# Patient Record
Sex: Male | Born: 1990 | Race: Black or African American | Hispanic: No | Marital: Single | Smoking: Never smoker
Health system: Southern US, Community
[De-identification: ages and names within clinical notes are randomized; demographics above are authoritative.]

## PROBLEM LIST (undated history)

## (undated) HISTORY — PX: TONSILLECTOMY: SUR1361

## (undated) HISTORY — PX: ADENOIDECTOMY: SUR15

---

## 2012-07-15 ENCOUNTER — Emergency Department (HOSPITAL_COMMUNITY): Payer: Self-pay

## 2012-07-15 ENCOUNTER — Encounter (HOSPITAL_COMMUNITY): Payer: Self-pay | Admitting: Emergency Medicine

## 2012-07-15 ENCOUNTER — Emergency Department (HOSPITAL_COMMUNITY)
Admission: EM | Admit: 2012-07-15 | Discharge: 2012-07-15 | Disposition: A | Discharge status: Home / Self Care | Payer: Self-pay | Attending: Emergency Medicine | Admitting: Emergency Medicine

## 2012-07-15 DIAGNOSIS — S6980XA Other specified injuries of unspecified wrist, hand and finger(s), initial encounter: Secondary | ICD-10-CM | POA: Insufficient documentation

## 2012-07-15 DIAGNOSIS — W230XXA Caught, crushed, jammed, or pinched between moving objects, initial encounter: Secondary | ICD-10-CM | POA: Insufficient documentation

## 2012-07-15 DIAGNOSIS — M79644 Pain in right finger(s): Secondary | ICD-10-CM

## 2012-07-15 DIAGNOSIS — Y929 Unspecified place or not applicable: Secondary | ICD-10-CM | POA: Insufficient documentation

## 2012-07-15 DIAGNOSIS — Y939 Activity, unspecified: Secondary | ICD-10-CM | POA: Insufficient documentation

## 2012-07-15 DIAGNOSIS — S6990XA Unspecified injury of unspecified wrist, hand and finger(s), initial encounter: Secondary | ICD-10-CM | POA: Insufficient documentation

## 2012-07-15 NOTE — ED Provider Notes (Signed)
History    CSN: [id number] Arrival date & time 07/15/12  1026  First MD Initiated Contact with Patient 07/15/12 1049     Chief Complaint  Patient presents with  . Hand Pain   (Consider location/radiation/quality/duration/timing/severity/associated sxs/prior Treatment) HPI Comments: 22 y.o.male with no PMHx presents complaining of sudden onset pain to distal phalanx of index finger of right hand after slamming it between doors twice in the same location: once on Thursday and again on Sunday. Pt admits mild numbness, bruising, swelling, discoloration of nail. FROM. Pt states pain is moderate, described as pressure, localized, constant, worse with bending. Pt has taken no interventions.     Patient is a 22 y.o. male presenting with hand pain.  Hand Pain Associated symptoms include joint swelling. Pertinent negatives include no chest pain, diaphoresis, fever, headaches, nausea, neck pain, numbness, vomiting or weakness.   History reviewed. No pertinent past medical history. Past Surgical History  Procedure Laterality Date  . Tonsillectomy    . Adenoidectomy     No family history on file. History  Substance Use Topics  . Smoking status: Never Smoker   . Smokeless tobacco: Not on file  . Alcohol Use: No    Review of Systems  Constitutional: Negative for fever and diaphoresis.  HENT: Negative for neck pain and neck stiffness.   Eyes: Negative for visual disturbance.  Respiratory: Negative for apnea, chest tightness and shortness of breath.   Cardiovascular: Negative for chest pain and palpitations.  Gastrointestinal: Negative for nausea, vomiting, diarrhea and constipation.  Genitourinary: Negative for dysuria.  Musculoskeletal: Positive for joint swelling. Negative for gait problem.       Distal phalanx of right index finger  Skin: Positive for color change.       Distal phalanx of right index finger, bruising, swelling, nail discoloration    Neurological: Negative for  dizziness, weakness, light-headedness, numbness and headaches.    Allergies  Review of patient's allergies indicates no known allergies.  Home Medications   Current Outpatient Rx  Name  Route  Sig  Dispense  Refill  . ibuprofen (ADVIL,MOTRIN) 800 MG tablet   Oral   Take 800 mg by mouth every 8 (eight) hours as needed for pain.          BP 119/71  Pulse 59  Temp(Src) 97.9 F (36.6 C) (Oral)  Resp 18  Ht 6' (1.829 m)  Wt 155 lb (70.308 kg)  BMI 21.02 kg/m2  SpO2 98% Physical Exam  Nursing note and vitals reviewed. Constitutional: He is oriented to person, place, and time. He appears well-developed and well-nourished. No distress.  HENT:  Head: Normocephalic and atraumatic.  Eyes: Conjunctivae and EOM are normal.  Neck: Normal range of motion. Neck supple.  No meningeal signs  Cardiovascular: Normal rate, regular rhythm and normal heart sounds.  Exam reveals no gallop and no friction rub.   No murmur heard. Pulmonary/Chest: Effort normal and breath sounds normal. No respiratory distress. He has no wheezes. He has no rales. He exhibits no tenderness.  Abdominal: Soft. Bowel sounds are normal. He exhibits no distension. There is no tenderness. There is no rebound and no guarding.  Musculoskeletal: Normal range of motion. He exhibits no edema and no tenderness.  FROM to upper and lower extremities. FROM of all digits including injured index finger on left hand  Neurological: He is alert and oriented to person, place, and time. No cranial nerve deficit.  Speech is clear and goal oriented, follows commands Sensation normal to  light touch and two point discrimination Moves extremities without ataxia, coordination intact Normal gait and balance Normal strength in upper and lower extremities bilaterally including dorsiflexion and plantar flexion, strong and equal grip strength   Skin: Skin is warm and dry. He is not diaphoretic. No erythema.  Psychiatric: He has a normal mood and  affect.    ED Course  Procedures (including critical care time) Labs Reviewed - No data to display Dg Finger Index Right  07/15/2012   *RADIOLOGY REPORT*  Clinical Data: Finger injury with numbness and swelling.  RIGHT INDEX FINGER 2+V  Comparison: None.  Findings: No acute osseous or joint abnormality.  Mild soft tissue swelling is seen distally.  IMPRESSION: No acute osseous or joint abnormality.   Original Report Authenticated By: Leanna Battles, M.D.   1. Pain in finger of right hand     MDM  Imaging shows no fracture. Directed pt to ice injury, take acetaminophen or ibuprofen for pain, and to elevate and rest the injury when possible. Drained some of the blood with 18 gauge needle to relieve pressure. Pt felt relief, better range of motion, and better sensation afterward. Discussed reasons to seek immediate care. Patient expresses understanding and agrees with plan.   Glade Nurse, PA-C 07/15/12 1701

## 2012-07-15 NOTE — ED Notes (Signed)
Patient states slammed his R index finger in between doors.  Patient advised is losing feeling in the finger tip and wanted to get checked out.

## 2012-07-16 NOTE — ED Provider Notes (Signed)
Medical screening examination/treatment/procedure(s) were performed by non-physician practitioner and as supervising physician I was immediately available for consultation/collaboration.  Joshaua Epple M Katriona Schmierer, MD 07/16/12 1411 

## 2014-08-28 IMAGING — CR DG FINGER INDEX 2+V*R*
3 series · 3 of 3 positions shown · non-contrast
Comparison: None.

CLINICAL DATA: Finger injury with numbness and swelling.

RIGHT INDEX FINGER 2+V

[x finger pa right]
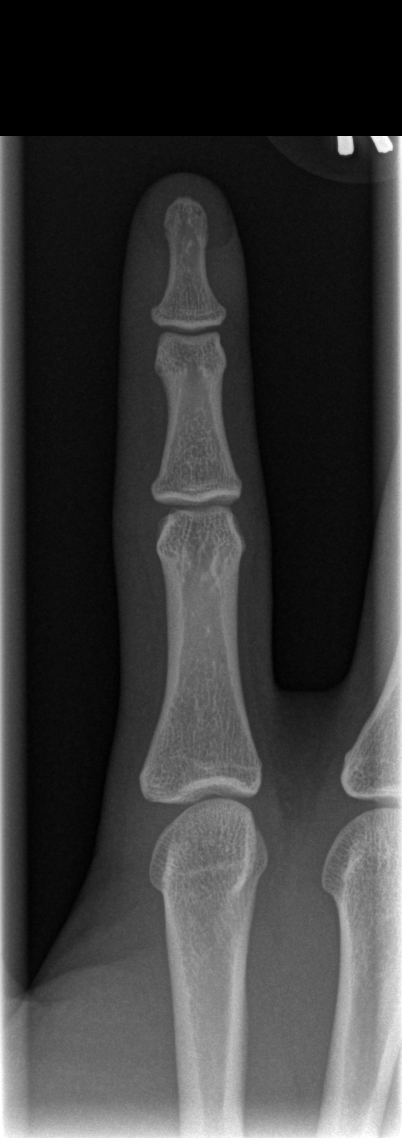

[x finger obl. right]
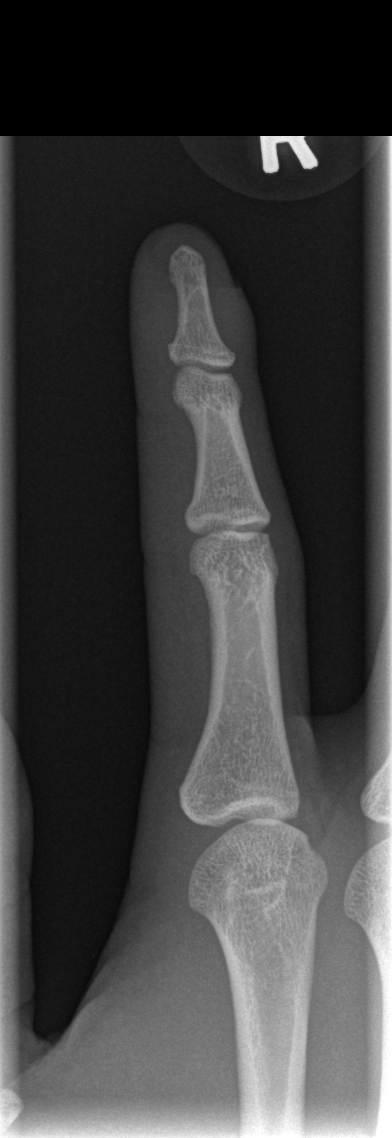

[x finger lateral right]
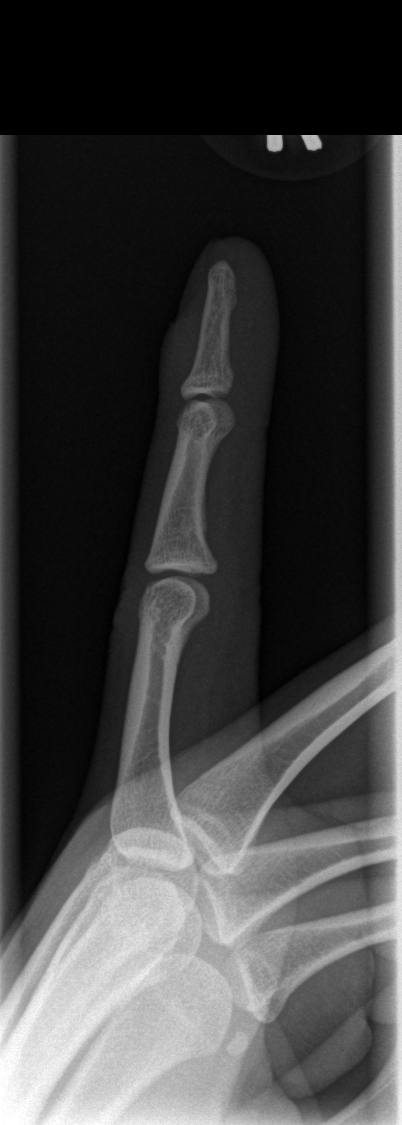

[3 of 3 positions shown; findings below may reference images not displayed]

FINDINGS: No acute osseous or joint abnormality.  Mild soft tissue
swelling is seen distally.
IMPRESSION: No acute osseous or joint abnormality.

## 2018-07-09 ENCOUNTER — Encounter (HOSPITAL_COMMUNITY): Payer: Self-pay

## 2018-07-09 ENCOUNTER — Other Ambulatory Visit: Payer: Self-pay

## 2018-07-09 ENCOUNTER — Ambulatory Visit (HOSPITAL_COMMUNITY)
Admission: EM | Admit: 2018-07-09 | Discharge: 2018-07-09 | Disposition: A | Discharge status: Home / Self Care | Payer: BC Managed Care – PPO | Attending: Family Medicine | Admitting: Family Medicine

## 2018-07-09 DIAGNOSIS — Z202 Contact with and (suspected) exposure to infections with a predominantly sexual mode of transmission: Secondary | ICD-10-CM | POA: Diagnosis not present

## 2018-07-09 DIAGNOSIS — Z113 Encounter for screening for infections with a predominantly sexual mode of transmission: Secondary | ICD-10-CM

## 2018-07-09 MED ORDER — METRONIDAZOLE 500 MG PO TABS
2000.0000 mg | ORAL_TABLET | Freq: Once | ORAL | Status: AC
  Administered 2018-07-09: 2000 mg via ORAL

## 2018-07-09 MED ORDER — METRONIDAZOLE 500 MG PO TABS
ORAL_TABLET | ORAL | Status: AC
  Filled 2018-07-09: qty 4

## 2018-07-09 NOTE — ED Provider Notes (Signed)
[place]    CSN: [id number] Arrival date & time: 07/09/18  3710      History   Chief Complaint Chief Complaint  Patient presents with  . Exposure to STD    HPI Andre Pitts is a 28 y.o. male.   HPI  Patient is here for STD check.  He states that his girlfriend called him yesterday to see she tested positive for trichomonas.  He states that he does have complete STD testing in February including RPR and HIV.  Everything was negative.  He has been with this lady in a monogamous relationship for the last 2 to 3 months.  He wishes to have STD testing again today as well as treatment for trichomonas.  He is asymptomatic  History reviewed. No pertinent past medical history.  There are no active problems to display for this patient.   Past Surgical History:  Procedure Laterality Date  . ADENOIDECTOMY    . TONSILLECTOMY         Home Medications    Prior to Admission medications   Medication Sig Start Date End Date Taking? Authorizing Provider  ibuprofen (ADVIL,MOTRIN) 800 MG tablet Take 800 mg by mouth every 8 (eight) hours as needed for pain.    [provider]    Family History Family History  Family history unknown: Yes    Social History Social History   Tobacco Use  . Smoking status: Never Smoker  Substance Use Topics  . Alcohol use: No  . Drug use: No     Allergies   Patient has no known allergies.   Review of Systems Review of Systems  Constitutional: Negative for chills and fever.  HENT: Negative for ear pain and sore throat.   Eyes: Negative for pain and visual disturbance.  Respiratory: Negative for cough and shortness of breath.   Cardiovascular: Negative for chest pain and palpitations.  Gastrointestinal: Negative for abdominal pain and vomiting.  Genitourinary: Negative for dysuria and hematuria.  Musculoskeletal: Negative for arthralgias and back pain.  Skin: Negative for color change and rash.  Neurological:  Negative for seizures and syncope.  All other systems reviewed and are negative.    Physical Exam Triage Vital Signs ED Triage Vitals  Enc Vitals Group     BP 07/09/18 1024 119/88     Pulse Rate 07/09/18 1024 83     Resp 07/09/18 1024 18     Temp 07/09/18 1024 99.3 F (37.4 C)     Temp Source 07/09/18 1024 Oral     SpO2 07/09/18 1024 99 %     Weight --      Height --      Head Circumference --      Peak Flow --      Pain Score 07/09/18 1025 0     Pain Loc --      Pain Edu? --      Excl. in [place]? --    No data found.  Updated Vital Signs BP 119/88 (BP Location: Left Arm)   Pulse 83   Temp 99.3 F (37.4 C) (Oral)   Resp 18   SpO2 99%   Visual Acuity Right Eye Distance:   Left Eye Distance:   Bilateral Distance:    Right Eye Near:   Left Eye Near:    Bilateral Near:     Physical Exam Constitutional:      General: He is not in acute distress.    Appearance: He is well-developed.  HENT:  Head: Normocephalic and atraumatic.  Eyes:     Conjunctiva/sclera: Conjunctivae normal.     Pupils: Pupils are equal, round, and reactive to light.  Neck:     Musculoskeletal: Normal range of motion.  Cardiovascular:     Rate and Rhythm: Normal rate.  Pulmonary:     Effort: Pulmonary effort is normal. No respiratory distress.  Abdominal:     General: There is no distension.     Palpations: Abdomen is soft.  Musculoskeletal: Normal range of motion.  Skin:    General: Skin is warm and dry.  Neurological:     Mental Status: He is alert.      UC Treatments / Results  Labs (all labs ordered are listed, but only abnormal results are displayed) Labs Reviewed  RPR  HIV ANTIBODY (ROUTINE TESTING W REFLEX)  URINE CYTOLOGY ANCILLARY ONLY    EKG None  Radiology No results found.  Procedures Procedures (including critical care time)  Medications Ordered in UC Medications  metroNIDAZOLE (FLAGYL) tablet 2,000 mg (has no administration in time range)   metroNIDAZOLE (FLAGYL) 500 MG tablet (has no administration in time range)    Initial Impression / Assessment and Plan / UC Course  I have reviewed the triage vital signs and the nursing notes.  Pertinent labs & imaging results that were available during my care of the patient were reviewed by me and considered in my medical decision making (see chart for details).      Final Clinical Impressions(s) / UC Diagnoses   Final diagnoses:  STD exposure     Discharge Instructions     Have been treated today for trichomoniasis We have done additional testing We did lab testing during this visit.  If there are any abnormal findings that require change in medicine or indicate a positive result, you will be notified.  If all of your tests are normal, you will not be called.   You need to avoid sexual relations for 7 days after treatment   ED Prescriptions    None     Controlled Substance Prescriptions [place] Controlled Substance Registry consulted? Not Applicable   Eustace MooreNelson, Matilde Markie Sue, MD 07/09/18 1045

## 2018-07-09 NOTE — Discharge Instructions (Addendum)
Have been treated today for trichomoniasis We have done additional testing We did lab testing during this visit.  If there are any abnormal findings that require change in medicine or indicate a positive result, you will be notified.  If all of your tests are normal, you will not be called.   You need to avoid sexual relations for 7 days after treatment

## 2018-07-09 NOTE — ED Triage Notes (Signed)
Pt presents to have STD Testing done after partner tested positive for trichimonias.

## 2018-07-10 LAB — URINE CYTOLOGY ANCILLARY ONLY
Chlamydia: NEGATIVE
Neisseria Gonorrhea: NEGATIVE
Trichomonas: NEGATIVE

## 2018-07-10 LAB — HIV ANTIBODY (ROUTINE TESTING W REFLEX): HIV Screen 4th Generation wRfx: NONREACTIVE

## 2018-07-10 LAB — RPR: RPR Ser Ql: NONREACTIVE
